# Patient Record
Sex: Male | Born: 1990 | Race: White | Hispanic: No | Marital: Single | Smoking: Current some day smoker
Health system: Southern US, Community
[De-identification: ages and names within clinical notes are randomized; demographics above are authoritative.]

---
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
## 2009-07-04 ENCOUNTER — Ambulatory Visit: Payer: Self-pay | Admitting: Diagnostic Radiology

## 2009-07-04 ENCOUNTER — Emergency Department (HOSPITAL_BASED_OUTPATIENT_CLINIC_OR_DEPARTMENT_OTHER): Admission: EM | Admit: 2009-07-04 | Discharge: 2009-07-04 | Payer: Self-pay | Admitting: Emergency Medicine

## 2020-06-13 ENCOUNTER — Emergency Department (HOSPITAL_COMMUNITY)
Admission: EM | Admit: 2020-06-13 | Discharge: 2020-06-13 | Disposition: A | Discharge status: Home / Self Care | Payer: Self-pay | Attending: Emergency Medicine | Admitting: Emergency Medicine

## 2020-06-13 ENCOUNTER — Encounter (HOSPITAL_COMMUNITY): Payer: Self-pay | Admitting: Obstetrics and Gynecology

## 2020-06-13 ENCOUNTER — Other Ambulatory Visit: Payer: Self-pay

## 2020-06-13 DIAGNOSIS — F17210 Nicotine dependence, cigarettes, uncomplicated: Secondary | ICD-10-CM | POA: Insufficient documentation

## 2020-06-13 DIAGNOSIS — L509 Urticaria, unspecified: Secondary | ICD-10-CM | POA: Insufficient documentation

## 2020-06-13 DIAGNOSIS — R0602 Shortness of breath: Secondary | ICD-10-CM | POA: Insufficient documentation

## 2020-06-13 DIAGNOSIS — F1210 Cannabis abuse, uncomplicated: Secondary | ICD-10-CM | POA: Insufficient documentation

## 2020-06-13 MED ORDER — FAMOTIDINE IN NACL 20-0.9 MG/50ML-% IV SOLN
20.0000 mg | Freq: Once | INTRAVENOUS | Status: AC
  Administered 2020-06-13: 20 mg via INTRAVENOUS
  Filled 2020-06-13: qty 50

## 2020-06-13 MED ORDER — EPINEPHRINE 0.3 MG/0.3ML IJ SOAJ: 0.3000 mg | INTRAMUSCULAR | 0 refills | Status: AC | PRN

## 2020-06-13 MED ORDER — SODIUM CHLORIDE 0.9 % IV SOLN
INTRAVENOUS | Status: DC
Start: 2020-06-13 — End: 2020-06-13

## 2020-06-13 MED ORDER — METHYLPREDNISOLONE SODIUM SUCC 125 MG IJ SOLR
125.0000 mg | Freq: Once | INTRAMUSCULAR | Status: AC
  Administered 2020-06-13: 125 mg via INTRAVENOUS
  Filled 2020-06-13: qty 2

## 2020-06-13 MED ORDER — DIPHENHYDRAMINE HCL 50 MG/ML IJ SOLN
25.0000 mg | Freq: Once | INTRAMUSCULAR | Status: AC
  Administered 2020-06-13: 25 mg via INTRAVENOUS
  Filled 2020-06-13: qty 1

## 2020-06-13 MED ORDER — PREDNISONE 10 MG PO TABS: 40.0000 mg | ORAL_TABLET | Freq: Every day | ORAL | 0 refills | Status: AC

## 2020-06-13 NOTE — ED Triage Notes (Signed)
Patient reports he was getting ready this morning and noticed a few hives on his belly and throughout the last few hours it has spread to his entire trunk, arms and neck. Patient reports he has no known allergies. Patient speaking in full sentences.

## 2020-06-13 NOTE — ED Provider Notes (Signed)
[facility]-EMERGENCY DEPT Provider Note   CSN: [id number] Arrival date & time: 06/13/20  1103     History Chief Complaint  Patient presents with  . Urticaria    Dylan Jackson is a 29 y.o. male presents to ER for evaluation of rash onset approx 1.5-2 hr PTA.  States he looked at his abdomen and saw to insect bites, did not see an actual insect bite him.  In the course of 1-2 hours he developed burning, stinging slightly itchy rash covering abdomen, chest, bilateral arms, proximal thighs, neck.  States he just moved and thinks maybe a spider bit him.  Denies recent changes in hygiene products. No new medicines. No exposure to ticks, woods.  Associated with mild shortness of breath, feeling flushed and hot.  Denies facial, lip, throat or tongue swelling, chest pain, vomiting, diarrhea. No known allergies.   HPI     No past medical history on file.  There are no problems to display for this patient.   ** The histories are not reviewed yet. Please review them in the "History" navigator section and refresh this SmartLink.     No family history on file.  Social History   Tobacco Use  . Smoking status: Current Some Day Smoker    Packs/day: 0.20    Types: Cigarettes  Substance Use Topics  . Alcohol use: Yes  . Drug use: Yes    Types: Marijuana    Home Medications Prior to Admission medications   Medication Sig Start Date End Date Taking? Authorizing Provider  EPINEPHrine 0.3 mg/0.3 mL IJ SOAJ injection Inject 0.3 mLs (0.3 mg total) into the muscle as needed for anaphylaxis. 06/13/20   Liberty Handy, PA-C  predniSONE (DELTASONE) 10 MG tablet Take 4 tablets (40 mg total) by mouth daily for 5 days. 06/13/20 06/18/20  Liberty Handy, PA-C    Allergies    Patient has no known allergies.  Review of Systems   Review of Systems  Respiratory: Positive for shortness of breath.   Skin: Positive for rash.  All other systems reviewed and are  negative.   Physical Exam Updated Vital Signs BP 105/67   Pulse (!) 54   Temp 98.4 F (36.9 C) (Oral)   Resp 18   Ht 5\' 8"  (1.727 m)   Wt 86.2 kg   SpO2 98%   BMI 28.89 kg/m   Physical Exam Vitals and nursing note reviewed.  Constitutional:      General: He is not in acute distress.    Appearance: He is well-developed.     Comments: NAD.  HENT:     Head: Normocephalic and atraumatic.     Right Ear: External ear normal.     Left Ear: External ear normal.     Nose: Nose normal.     Mouth/Throat:     Comments: Speaking in full sentences.  No obvious facial, lip, tongue or oropharyngeal edema.  Oropharynx and tonsils and uvula normal.  No intraoral lesions. Eyes:     General: No scleral icterus.    Conjunctiva/sclera: Conjunctivae normal.  Cardiovascular:     Rate and Rhythm: Normal rate and regular rhythm.     Pulses: Normal pulses.     Heart sounds: Normal heart sounds.  Pulmonary:     Effort: Pulmonary effort is normal.     Breath sounds: Normal breath sounds.  Musculoskeletal:        General: Normal range of motion.     Cervical back:  Normal range of motion and neck supple.  Skin:    General: Skin is warm and dry.     Capillary Refill: Capillary refill takes less than 2 seconds.     Findings: Rash present.     Comments: Raised, erythematous, nontender rash involving the anterior neck, chest, abdomen, proximal upper arms and proximal thighs bilaterally.  Blanchable.  No vesicular lesions.  No rash over the hands, between fingers, soles or toes.  Examination of the abdomen performed, I do not see any obvious insect bites or puncture wound.  Neurological:     Mental Status: He is alert and oriented to person, place, and time.  Psychiatric:        Behavior: Behavior normal.        Thought Content: Thought content normal.        Judgment: Judgment normal.     ED Results / Procedures / Treatments   Labs (all labs ordered are listed, but only abnormal results are  displayed) Labs Reviewed - No data to display  EKG None  Radiology No results found.  Procedures Procedures (including critical care time)  Medications Ordered in ED Medications  0.9 %  sodium chloride infusion ( Intravenous New Bag/Given 06/13/20 1148)  diphenhydrAMINE (BENADRYL) injection 25 mg (25 mg Intravenous Given 06/13/20 1148)  methylPREDNISolone sodium succinate (SOLU-MEDROL) 125 mg/2 mL injection 125 mg (125 mg Intravenous Given 06/13/20 1148)  famotidine (PEPCID) IVPB 20 mg premix (0 mg Intravenous Stopped 06/13/20 1217)    ED Course  I have reviewed the triage vital signs and the nursing notes.  Pertinent labs & imaging results that were available during my care of the patient were reviewed by me and considered in my medical decision making (see chart for details).    MDM Rules/Calculators/A&P                          Presentation is most consistent with hypersensitivity reaction, urticaria.  He reports mild shortness of breath but vital signs are normal without any hypotension, tachycardia, hypoxia or tachypnea.  Normal work of breathing.  No signs of facial or oral angioedema.  He has no chest pain.  No evidence of severe reaction or anaphylaxis.  I have ordered IV normal saline, Pepcid, Benadryl, Depo-Medrol.  Will monitor closely.  No indication for emergent lab work or imaging.  Patient reevaluated and has moderate improvement in rash, he feels better.  No evidence of anaphylaxis to warrant EpiPen.  Will discharge with Pepcid, Benadryl, prednisone and prescription for EpiPen.  Educated patient on how and when to use EpiPen.  Patient understands risk of relapse/biphasic reaction is possible but unlikely. Strict return precautions given. Patient comfortable with this plan.  Final Clinical Impression(s) / ED Diagnoses Final diagnoses:  Urticaria    Rx / DC Orders ED Discharge Orders         Ordered    predniSONE (DELTASONE) 10 MG tablet  Daily     Discontinue   Reprint     06/13/20 1329    EPINEPHrine 0.3 mg/0.3 mL IJ SOAJ injection  As needed     Discontinue  Reprint     06/13/20 1329           Liberty Handy, PA-C 06/13/20 1344    Mancel Bale, MD 06/14/20 [phone]

## 2020-06-13 NOTE — Discharge Instructions (Addendum)
You were seen in the ER for rash  I suspect this rash is from some type of hypersensitivity reaction to an unknown allergy. You did not have symptoms of severe reaction.  Please take 25 mg benadryl every 6 hours for the next 5 days.  Take Famotidine 20 mg daily for 5 days.  Take prednisone 40 mg daily for 5 days.  Prescription for injectable epinephrine was given to you.  Use this in your thigh if you develop symptoms of severe reaction (anaphylactic reaction) like severe facial tongue lip or throat swelling, difficulty breathing

## 2020-09-19 ENCOUNTER — Emergency Department (HOSPITAL_COMMUNITY): Payer: No Typology Code available for payment source

## 2020-09-19 ENCOUNTER — Encounter (HOSPITAL_COMMUNITY): Payer: Self-pay

## 2020-09-19 ENCOUNTER — Emergency Department (HOSPITAL_COMMUNITY)
Admission: EM | Admit: 2020-09-19 | Discharge: 2020-09-19 | Disposition: A | Discharge status: Home / Self Care | Payer: No Typology Code available for payment source | Attending: Emergency Medicine | Admitting: Emergency Medicine

## 2020-09-19 DIAGNOSIS — M25511 Pain in right shoulder: Secondary | ICD-10-CM | POA: Diagnosis present

## 2020-09-19 DIAGNOSIS — Y9389 Activity, other specified: Secondary | ICD-10-CM | POA: Insufficient documentation

## 2020-09-19 DIAGNOSIS — R109 Unspecified abdominal pain: Secondary | ICD-10-CM | POA: Diagnosis not present

## 2020-09-19 DIAGNOSIS — Y92410 Unspecified street and highway as the place of occurrence of the external cause: Secondary | ICD-10-CM | POA: Insufficient documentation

## 2020-09-19 DIAGNOSIS — F17210 Nicotine dependence, cigarettes, uncomplicated: Secondary | ICD-10-CM | POA: Diagnosis not present

## 2020-09-19 DIAGNOSIS — M25512 Pain in left shoulder: Secondary | ICD-10-CM

## 2020-09-19 LAB — CBC
HCT: 50 % (ref 39.0–52.0)
Hemoglobin: 16.8 g/dL (ref 13.0–17.0)
MCH: 32.4 pg (ref 26.0–34.0)
MCHC: 33.6 g/dL (ref 30.0–36.0)
MCV: 96.3 fL (ref 80.0–100.0)
Platelets: 282 10*3/uL (ref 150–400)
RBC: 5.19 MIL/uL (ref 4.22–5.81)
RDW: 12.9 % (ref 11.5–15.5)
WBC: 12.7 10*3/uL — ABNORMAL HIGH (ref 4.0–10.5)
nRBC: 0 % (ref 0.0–0.2)

## 2020-09-19 LAB — BASIC METABOLIC PANEL
Anion gap: 11 (ref 5–15)
BUN: 17 mg/dL (ref 6–20)
CO2: 23 mmol/L (ref 22–32)
Calcium: 8.8 mg/dL — ABNORMAL LOW (ref 8.9–10.3)
Chloride: 102 mmol/L (ref 98–111)
Creatinine, Ser: 1 mg/dL (ref 0.61–1.24)
GFR, Estimated: >60 mL/min (ref >60)
Glucose, Bld: 101 mg/dL — ABNORMAL HIGH (ref 70–99)
Potassium: 4.1 mmol/L (ref 3.5–5.1)
Sodium: 136 mmol/L (ref 135–145)

## 2020-09-19 LAB — TROPONIN I (HIGH SENSITIVITY): Troponin I (High Sensitivity): 3 ng/L (ref <18)

## 2020-09-19 MED ORDER — IBUPROFEN 200 MG PO TABS
400.0000 mg | ORAL_TABLET | Freq: Once | ORAL | Status: AC
  Administered 2020-09-19: 400 mg via ORAL
  Filled 2020-09-19: qty 2
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
## 2020-09-19 NOTE — ED Notes (Signed)
Patient has a blue top in the main lab °
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
## 2020-09-19 NOTE — ED Notes (Signed)
Per Berle Mull, PA do not need to repeat troponin lab draw.

## 2020-09-19 NOTE — ED Notes (Signed)
An After Visit Summary was printed and given to the patient. Discharge instructions given and no further questions at this time.  

## 2022-03-02 IMAGING — CR DG CHEST 2V
2 series · 2 of 2 positions shown · non-contrast
Comparison: None.

CLINICAL DATA: Chest pain

EXAM:
CHEST - 2 VIEW

[w chest pa]
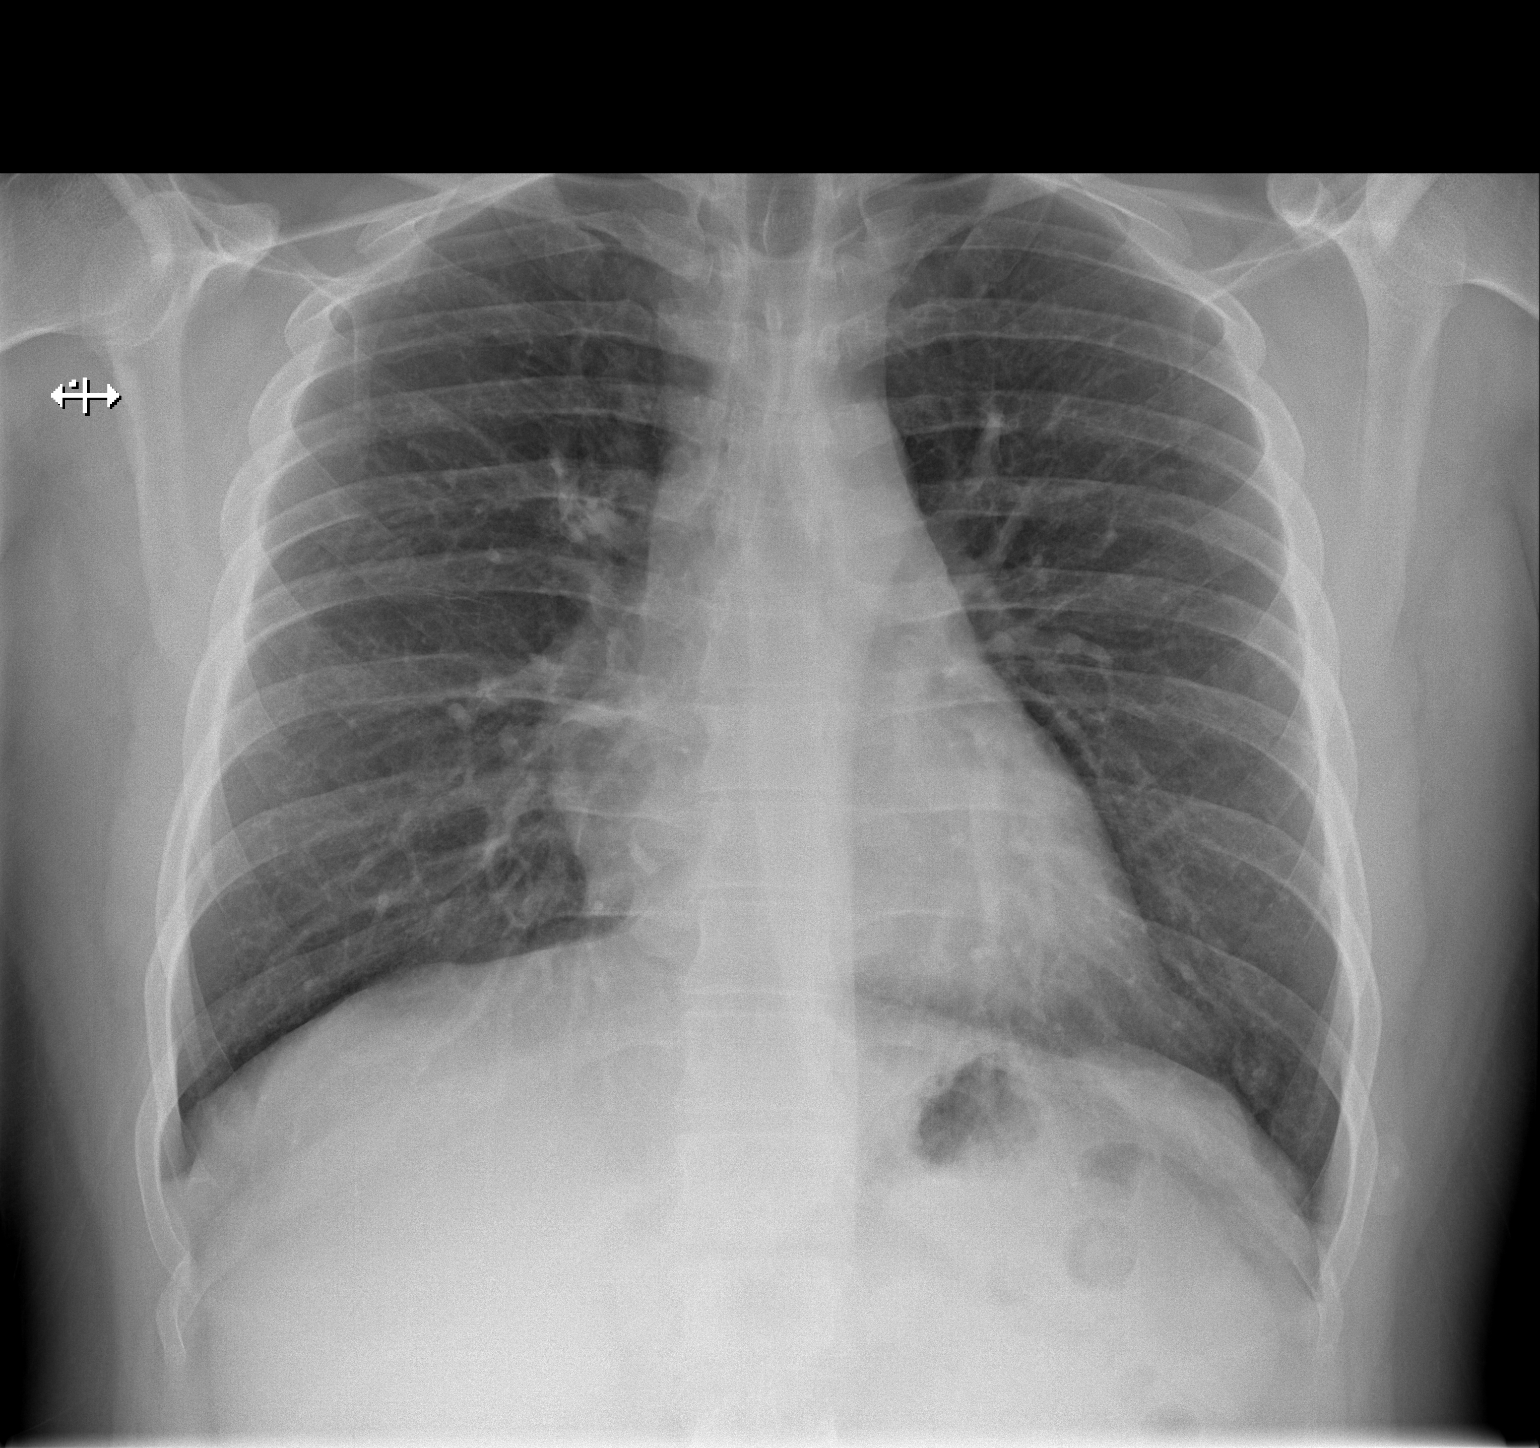

[w chest lat]
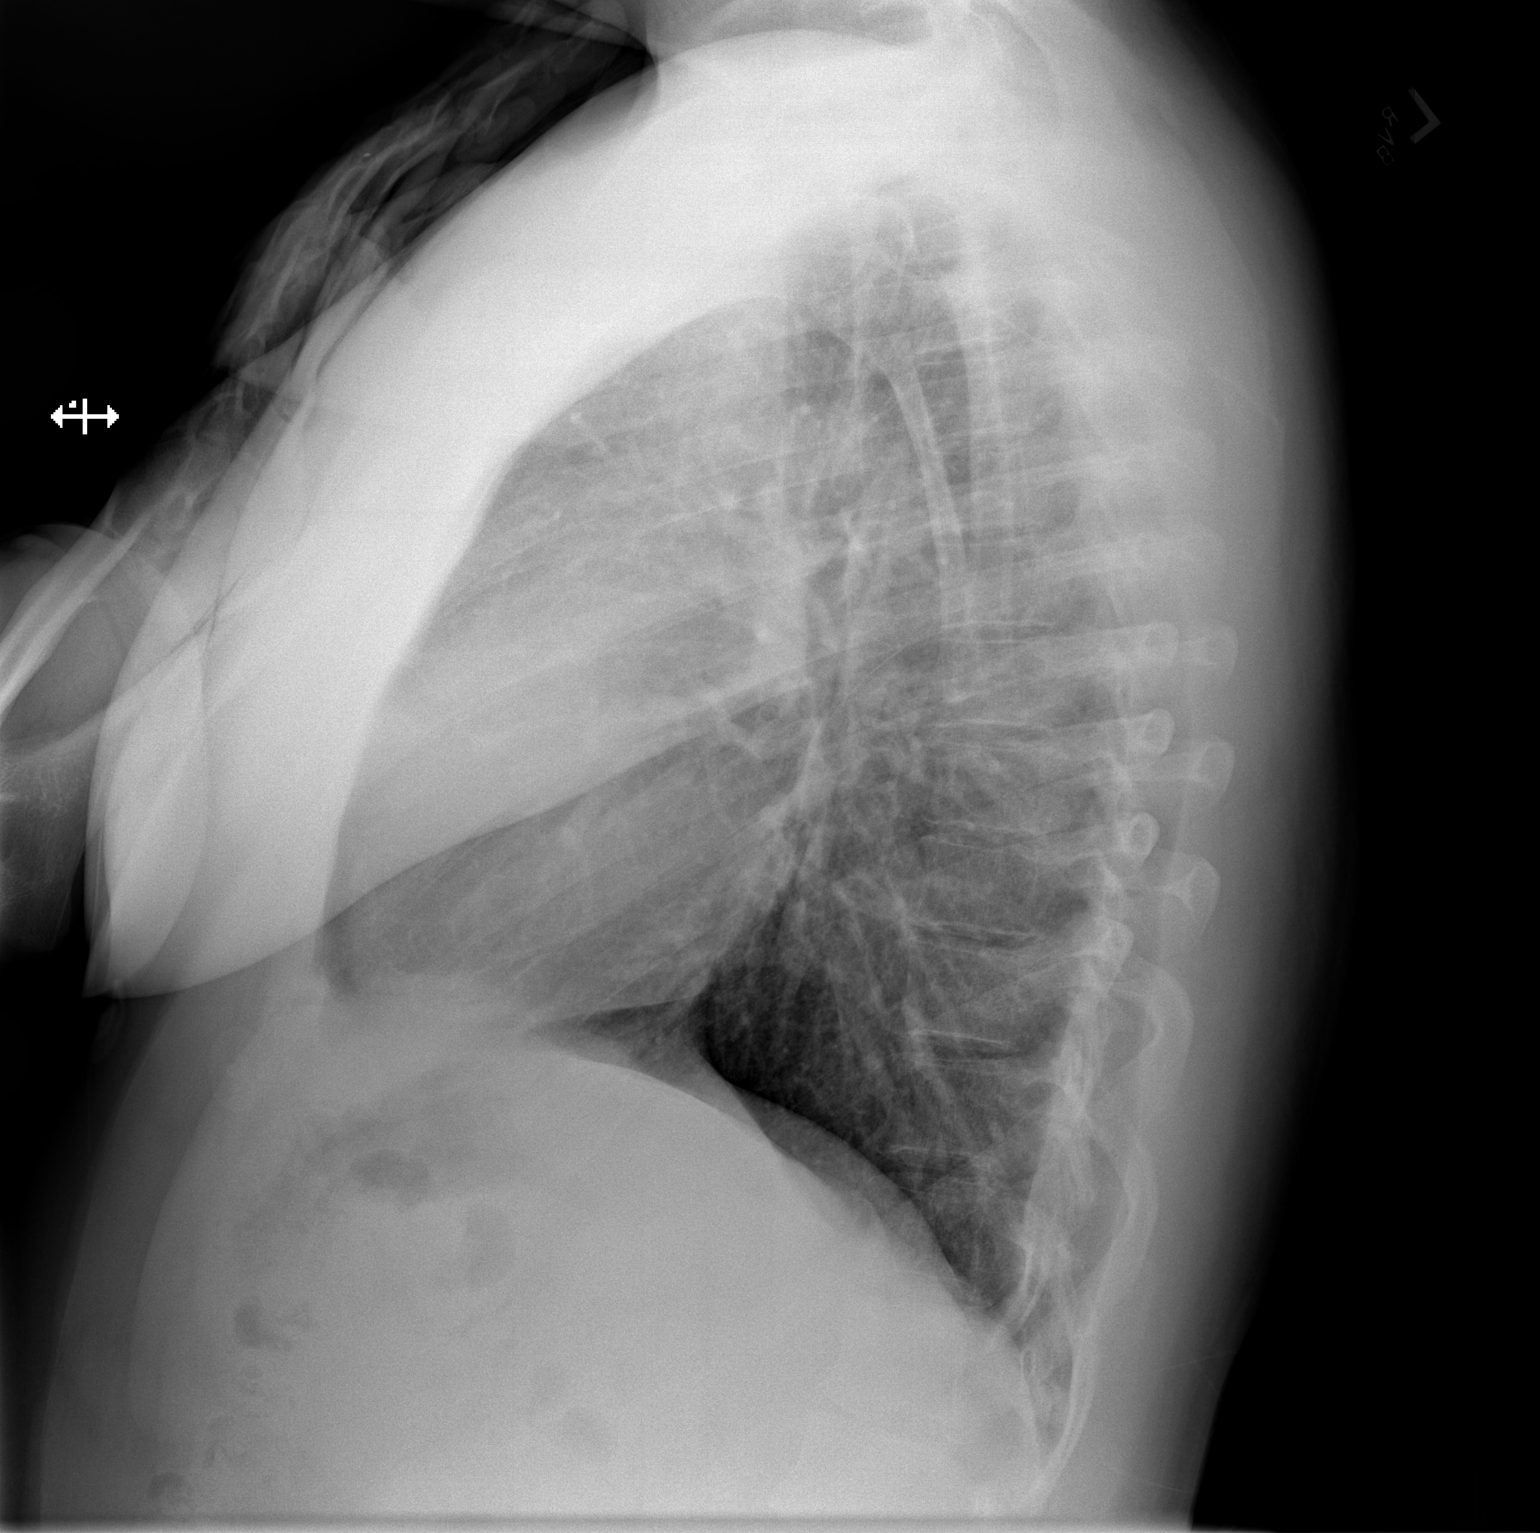

[2 of 2 positions shown; findings below may reference images not displayed]

FINDINGS: Lungs are clear. The heart size and pulmonary vascularity are
normal. No adenopathy. No pneumothorax. No bone lesions.
IMPRESSION: Lungs clear.  Cardiac silhouette normal.

## 2024-06-04 DIAGNOSIS — R071 Chest pain on breathing: Secondary | ICD-10-CM | POA: Diagnosis not present

## 2024-06-04 DIAGNOSIS — R9431 Abnormal electrocardiogram [ECG] [EKG]: Secondary | ICD-10-CM | POA: Diagnosis not present

## 2024-06-04 DIAGNOSIS — M25561 Pain in right knee: Secondary | ICD-10-CM | POA: Diagnosis not present

## 2024-06-04 DIAGNOSIS — R0602 Shortness of breath: Secondary | ICD-10-CM | POA: Diagnosis not present

## 2024-06-04 DIAGNOSIS — I4891 Unspecified atrial fibrillation: Secondary | ICD-10-CM | POA: Diagnosis not present

## 2024-06-04 DIAGNOSIS — M25562 Pain in left knee: Secondary | ICD-10-CM | POA: Diagnosis not present

## 2024-06-04 DIAGNOSIS — F17210 Nicotine dependence, cigarettes, uncomplicated: Secondary | ICD-10-CM | POA: Diagnosis not present

## 2024-06-04 DIAGNOSIS — R079 Chest pain, unspecified: Secondary | ICD-10-CM | POA: Diagnosis not present

## 2024-06-22 DIAGNOSIS — F17210 Nicotine dependence, cigarettes, uncomplicated: Secondary | ICD-10-CM | POA: Diagnosis not present

## 2024-06-22 DIAGNOSIS — L03115 Cellulitis of right lower limb: Secondary | ICD-10-CM | POA: Diagnosis not present

## 2024-07-20 DIAGNOSIS — M25571 Pain in right ankle and joints of right foot: Secondary | ICD-10-CM | POA: Diagnosis not present

## 2024-07-20 DIAGNOSIS — S91001A Unspecified open wound, right ankle, initial encounter: Secondary | ICD-10-CM | POA: Diagnosis not present

## 2024-10-04 DIAGNOSIS — L732 Hidradenitis suppurativa: Secondary | ICD-10-CM | POA: Diagnosis not present

## 2024-10-04 DIAGNOSIS — F17210 Nicotine dependence, cigarettes, uncomplicated: Secondary | ICD-10-CM | POA: Diagnosis not present

## 2024-10-04 DIAGNOSIS — L02412 Cutaneous abscess of left axilla: Secondary | ICD-10-CM | POA: Diagnosis not present

## 2024-10-13 DIAGNOSIS — L02414 Cutaneous abscess of left upper limb: Secondary | ICD-10-CM | POA: Diagnosis not present
